# Patient Record
Sex: Male | Born: 1950 | Race: White | Hispanic: No | Marital: Married | Smoking: Former smoker
Health system: Southern US, Community
[De-identification: ages and names within clinical notes are randomized; demographics above are authoritative.]

## PROBLEM LIST (undated history)

## (undated) DIAGNOSIS — G4710 Hypersomnia, unspecified: Secondary | ICD-10-CM

## (undated) DIAGNOSIS — R569 Unspecified convulsions: Secondary | ICD-10-CM

## (undated) DIAGNOSIS — H9190 Unspecified hearing loss, unspecified ear: Secondary | ICD-10-CM

## (undated) DIAGNOSIS — I712 Thoracic aortic aneurysm, without rupture, unspecified: Secondary | ICD-10-CM

## (undated) DIAGNOSIS — E785 Hyperlipidemia, unspecified: Secondary | ICD-10-CM

## (undated) DIAGNOSIS — I10 Essential (primary) hypertension: Secondary | ICD-10-CM

## (undated) HISTORY — DX: Unspecified hearing loss, unspecified ear: H91.90

## (undated) HISTORY — DX: Hyperlipidemia, unspecified: E78.5

## (undated) HISTORY — DX: Essential (primary) hypertension: I10

## (undated) HISTORY — DX: Hypersomnia, unspecified: G47.10

## (undated) HISTORY — DX: Thoracic aortic aneurysm, without rupture, unspecified: I71.20

## (undated) HISTORY — PX: CHOLECYSTECTOMY: SHX55

---

## 2008-06-29 ENCOUNTER — Inpatient Hospital Stay: Payer: Self-pay | Admitting: Vascular Surgery

## 2008-06-29 ENCOUNTER — Other Ambulatory Visit: Payer: Self-pay

## 2009-09-18 IMAGING — CR DG CHEST 1V PORT
1 series · 1 of 1 positions shown · non-contrast
Comparison: none

REASON FOR EXAM: Chest Pain
COMMENTS:

PROCEDURE:     DXR - DXR PORTABLE CHEST SINGLE VIEW  - June 29, 2008  [DATE]
RESULT:     Comparison: No comparison

[view not recorded]
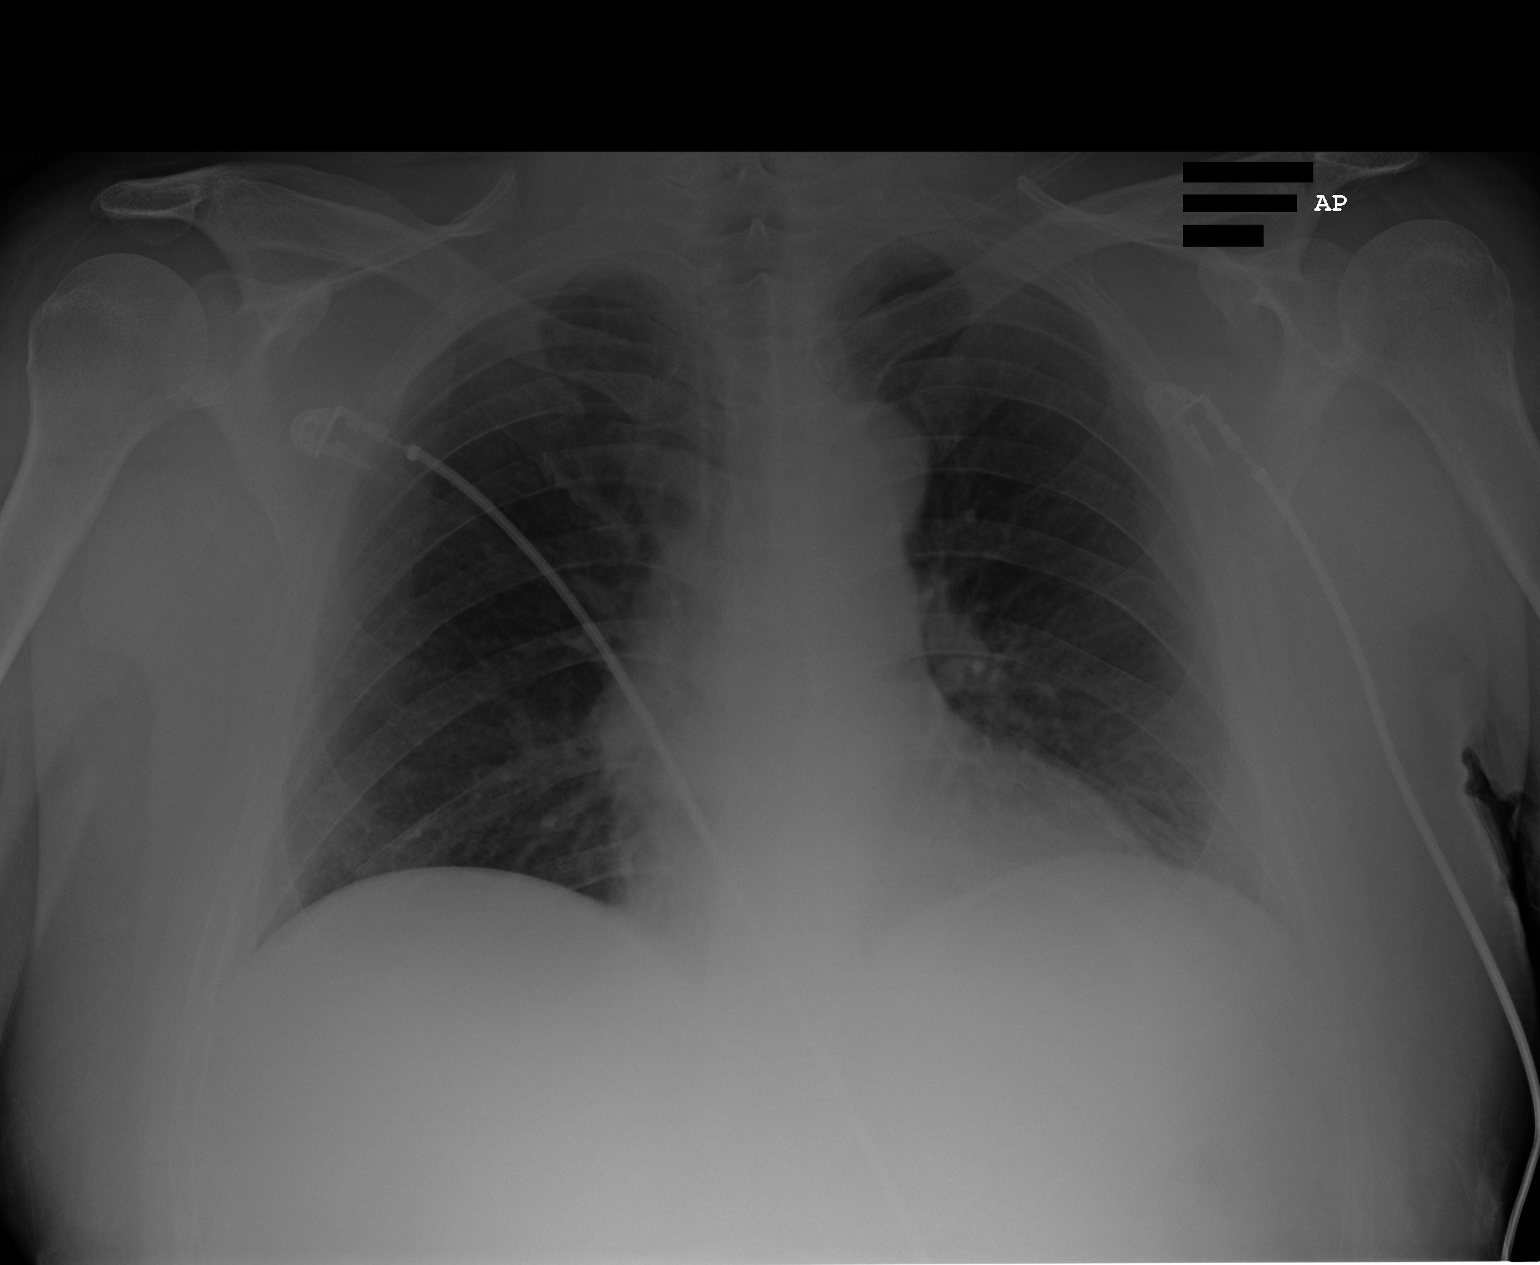

[1 of 1 positions shown; findings below may reference images not displayed]

FINDINGS: Single portable AP chest radiograph is provided. There is no focal
parenchymal opacity, pleural effusion, or pneumothorax. Normal
cardiomediastinal silhouette. The osseous structures are unremarkable.
IMPRESSION: No acute disease of the chest.

## 2009-09-18 IMAGING — US ABDOMEN ULTRASOUND
1 series · 17 of 25 positions shown · non-contrast
Comparison: No comparisons

REASON FOR EXAM: RUQ pain
COMMENTS:

PROCEDURE:     US  - US ABDOMEN GENERAL SURVEY  - June 30, 2008 [DATE]
RESULT:     Indication: Pain
TECHNIQUE: Multiple gray-scale and color-flow Doppler images of the abdomen
are presented for review.

[Series 1: abdomen ultrasound · 17 of 59 slices shown]
[im 1/59]
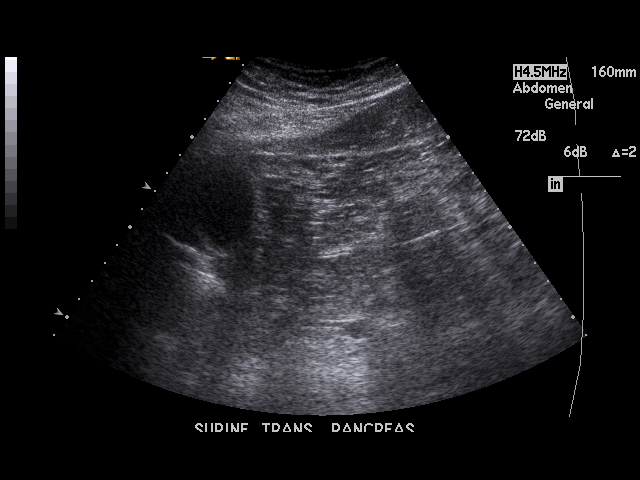
[im 5/59]
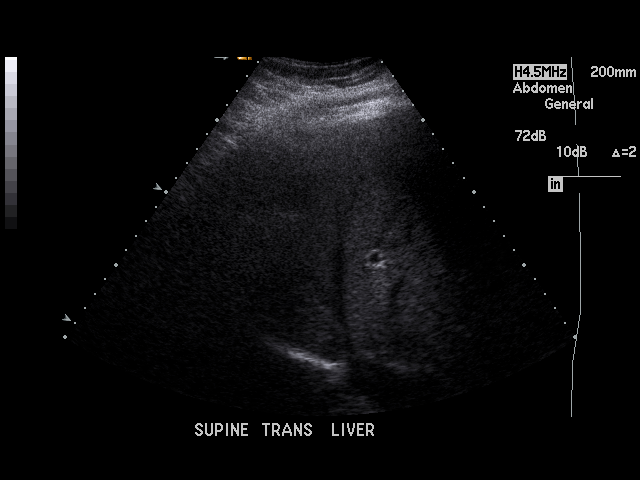
[im 8/59]
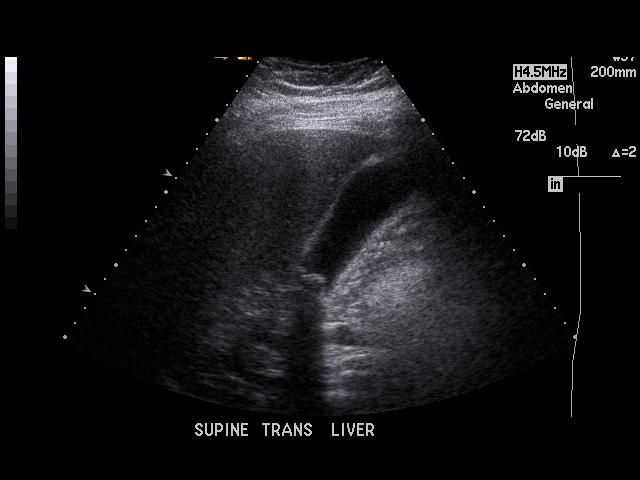
[im 13/59]
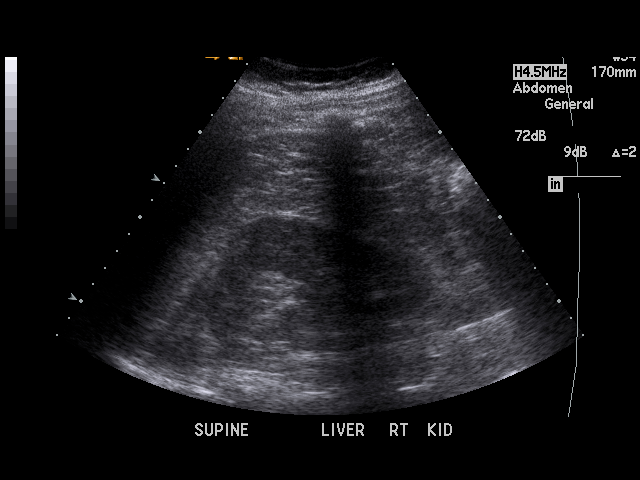
[im 15/59]
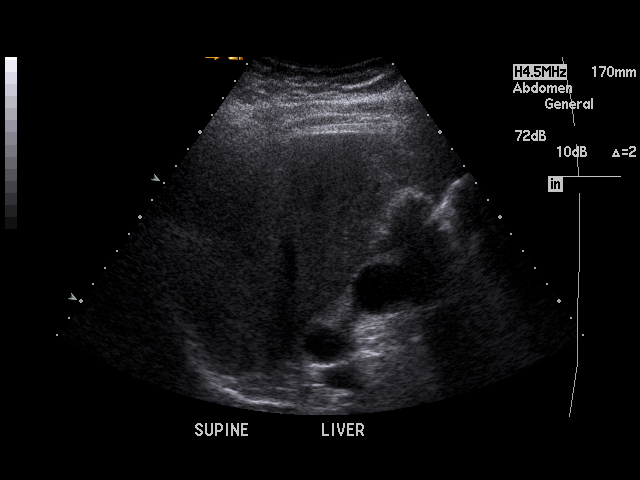
[im 20/59]
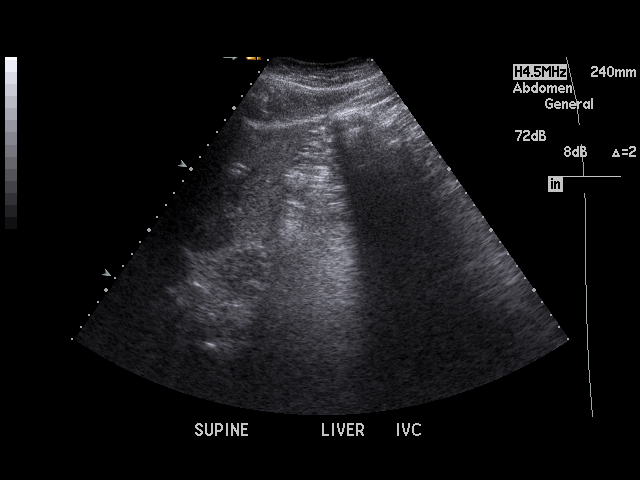
[im 22/59]
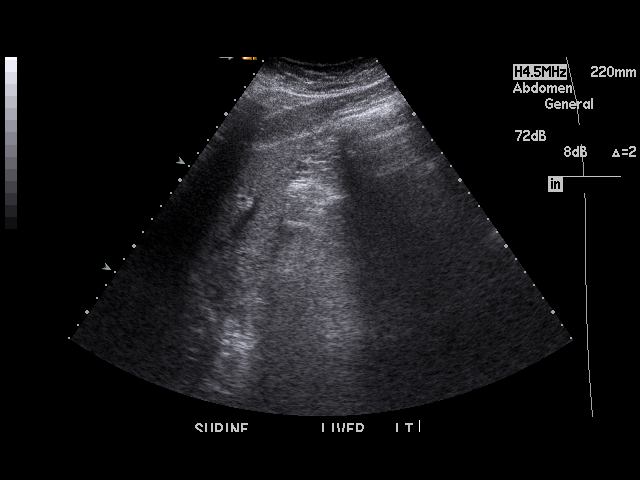
[im 27/59]
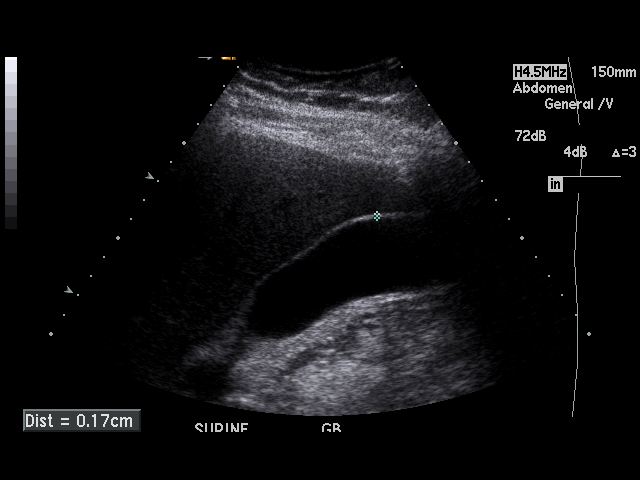
[im 30/59]
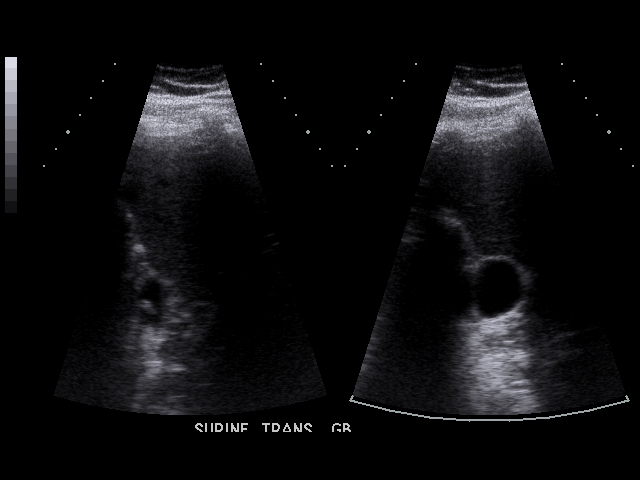
[im 32/59]
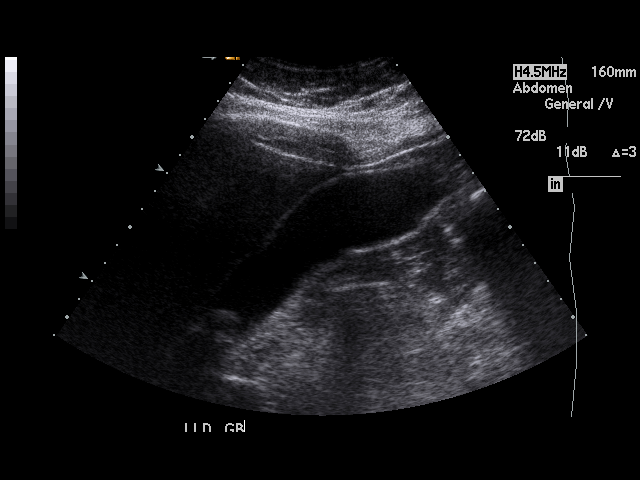
[im 37/59]
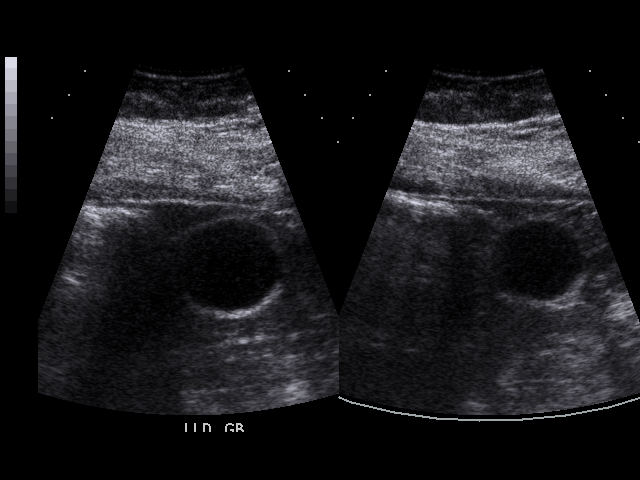
[im 39/59]
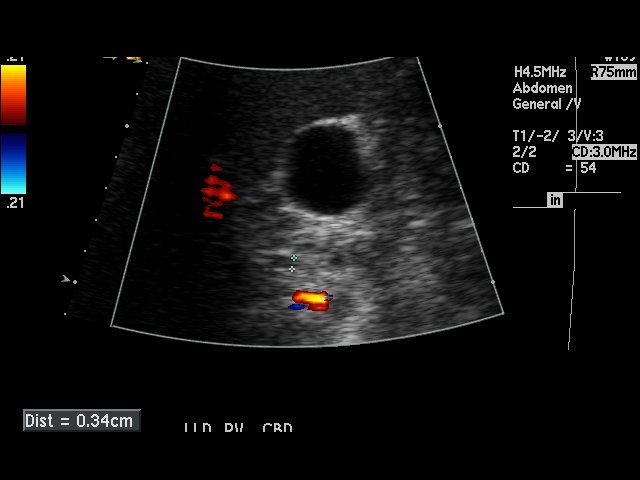
[im 44/59]
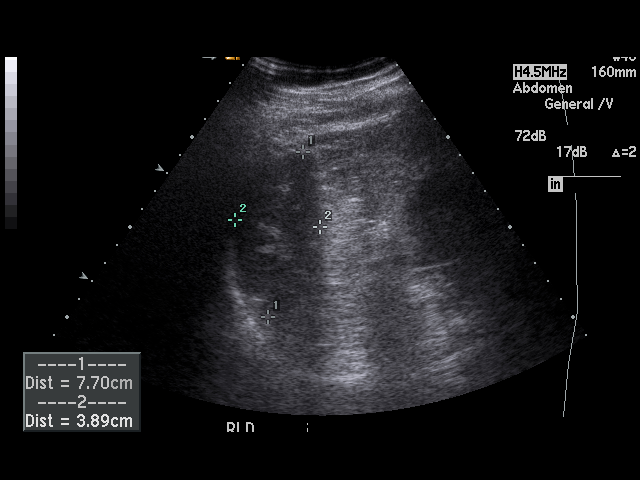
[im 46/59]
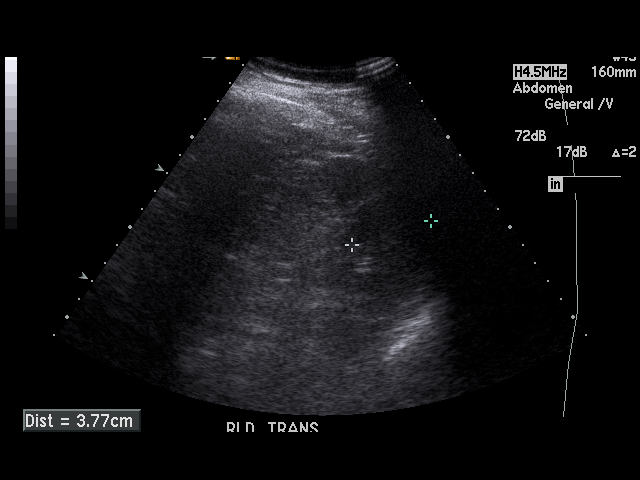
[im 51/59]
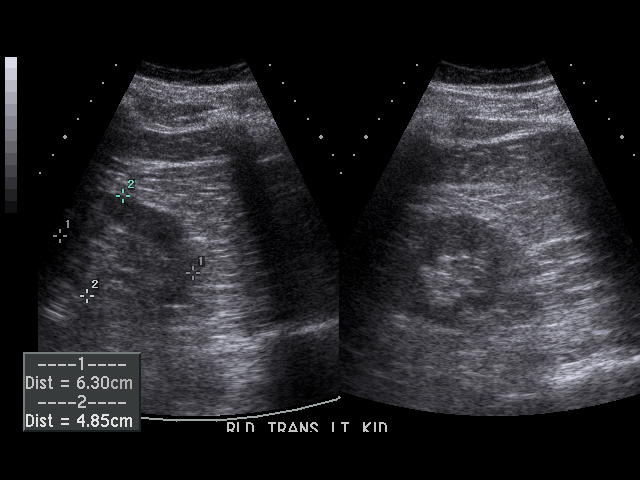
[im 54/59]
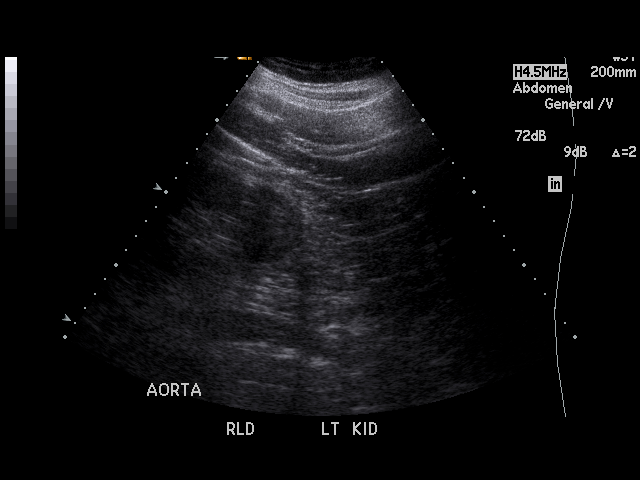
[im 59/59]
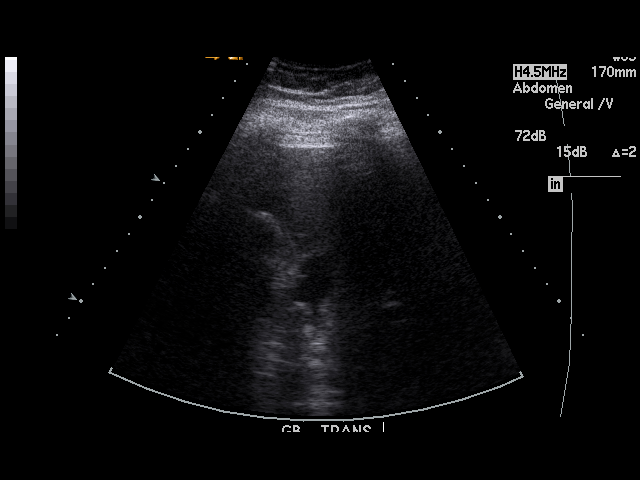

[17 of 25 positions shown; findings below may reference images not displayed]

FINDINGS: Visualized portions of the liver demonstrate normal echogenicity and normal
contours. The liver is without evidence of  focal hepatic lesion. There is
no intra or extrahepatic biliary ductal dilatation. The common duct measures
0.4 cm in maximal diameter. There is an approximately 1.5 cm nonmobile
gallstone in the region of the proximal body and neck region of the
gallbladder with multiple adjacent cholelithiasis. There is no gallbladder
wall thickening or pericholecystic fluid. There is no sonographic Murphy's
sign.

The pancreas is not visualized secondary to overlying bowel gas. The spleen
is unremarkable. Bilateral kidneys are normal in echogenicity and size. The
right kidney measures 11 cm. The left kidney measures 12.7 cm. There are no
renal calculi or hydronephrosis. The abdominal aorta and IVC are
unremarkable.
IMPRESSION: 1. Cholelithiasis without sonographic evidence of acute cholecystitis.

## 2009-09-18 IMAGING — CT CT ABD-PELV W/ CM
1 of 2 series · 15 of 32 positions shown, 19 images · non-contrast
Comparison: No comparison

REASON FOR EXAM: (1) pain; (2) pain///////     IV CONTRAST ONLY
COMMENTS:

PROCEDURE:     CT  - CT ABDOMEN / PELVIS  W  - June 29, 2008  [DATE]
RESULT:     CT abdomen and pelvis
HISTORY: Pain
TECHNIQUE: Multiple axial images of the abdomen and pelvis were performed
from the lung bases to the pubic symphysis, without p.o. contrast and with
100 ml of [id number] intravenous contrast.

[Series 2: abdomen · axial · 0.76mm/px · z∈[-503,-63]mm · 15 of 61 slices shown, 19 images]
[im 3/61  soft-tissue]
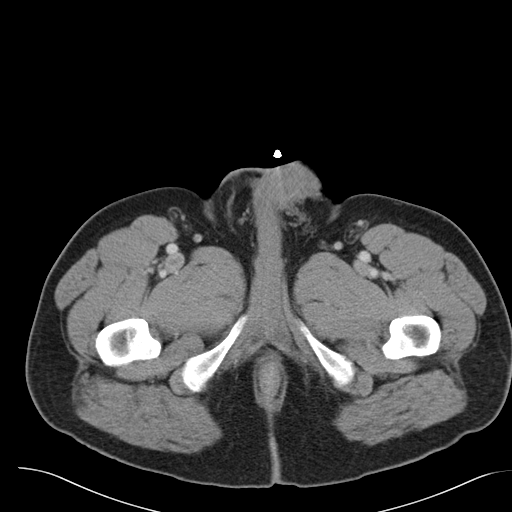
[im 3/61  bone]
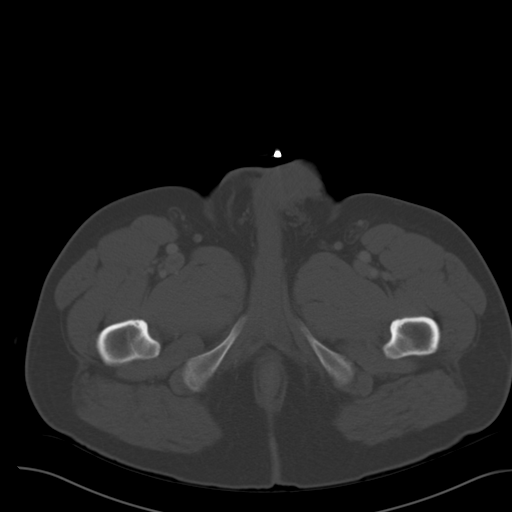
[im 8/61  soft-tissue]
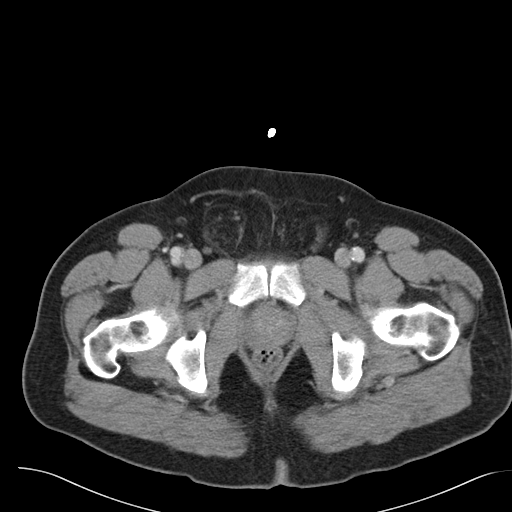
[im 13/61  soft-tissue]
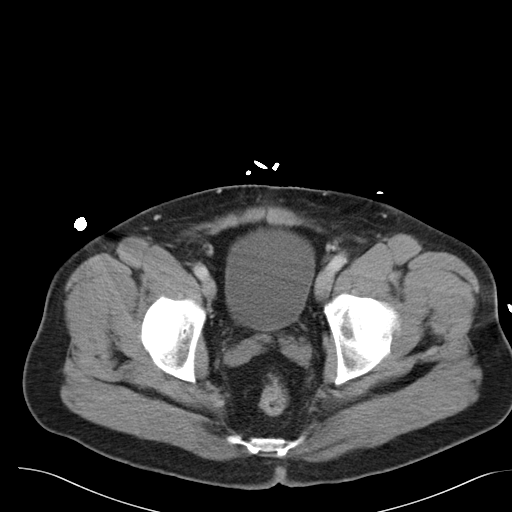
[im 18/61  soft-tissue]
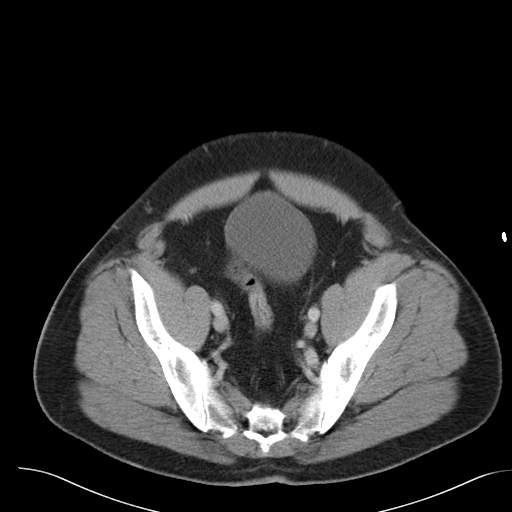
[im 21/61  soft-tissue]
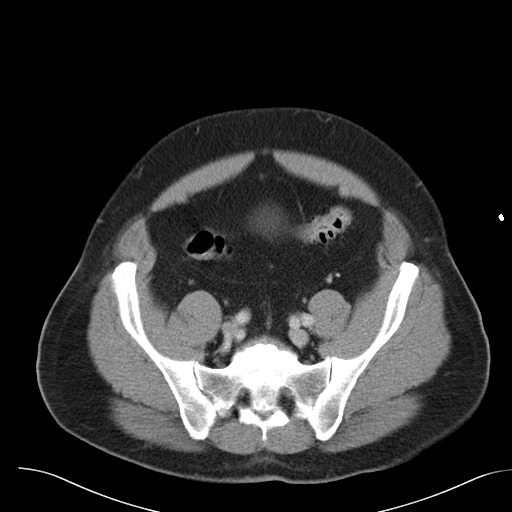
[im 26/61  soft-tissue]
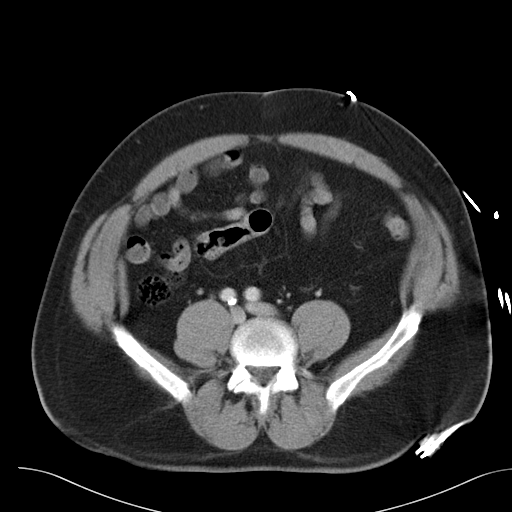
[im 31/61  soft-tissue]
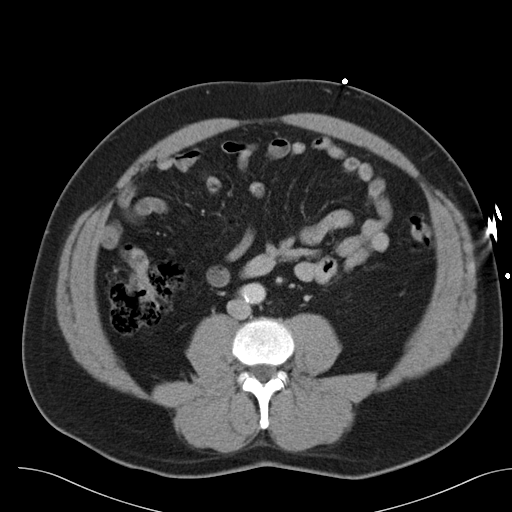
[im 36/61  soft-tissue]
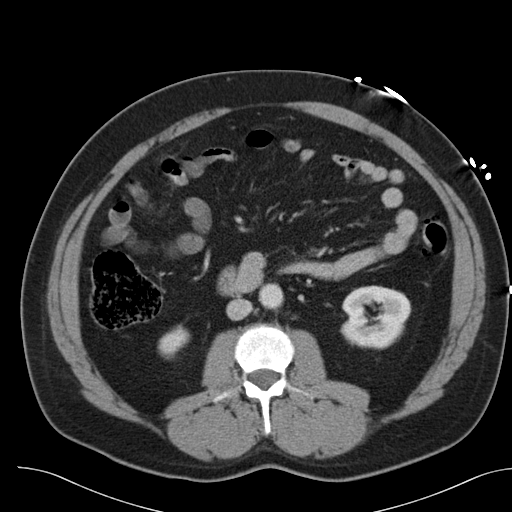
[im 41/61  soft-tissue]
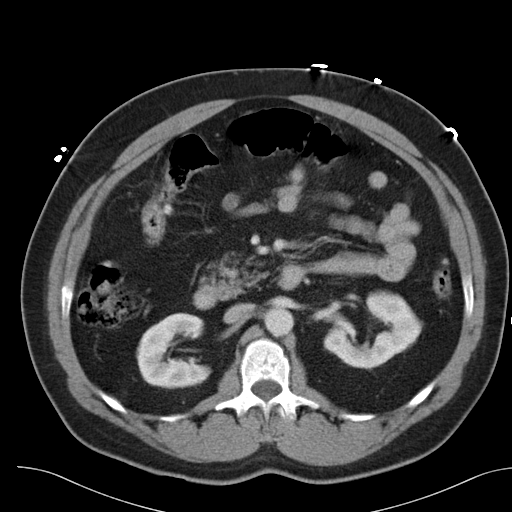
[im 41/61  bone]
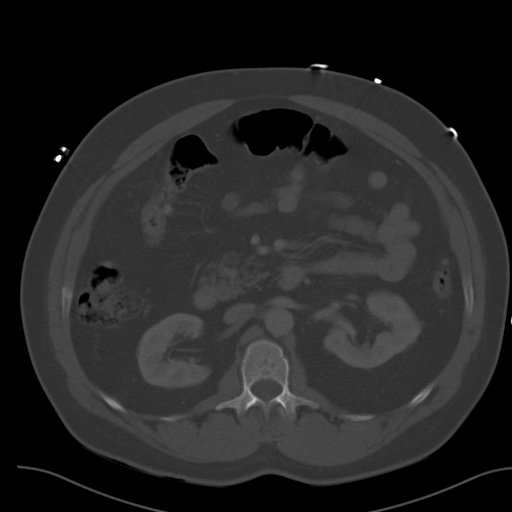
[im 43/61  soft-tissue]
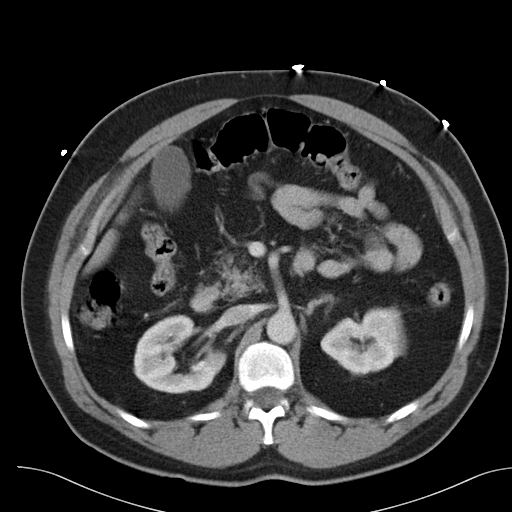
[im 48/61  soft-tissue]
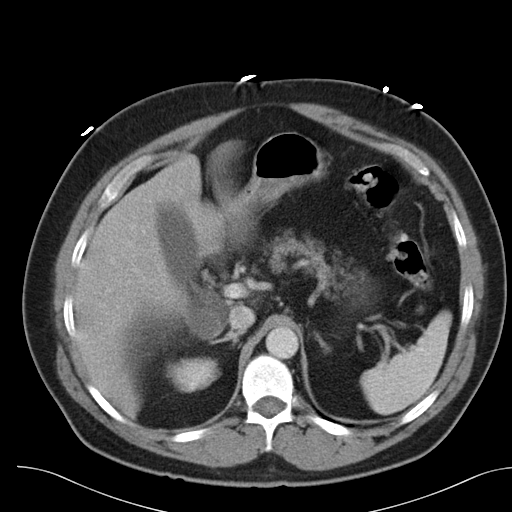
[im 51/61  lung]
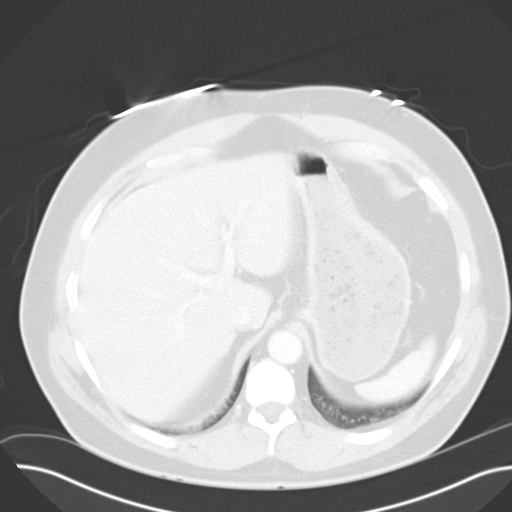
[im 53/61  soft-tissue]
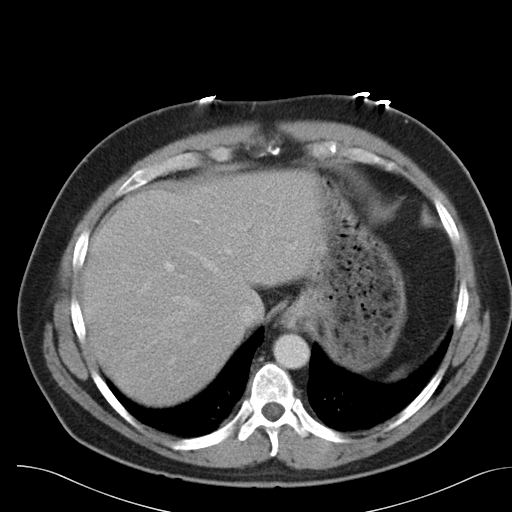
[im 53/61  lung]
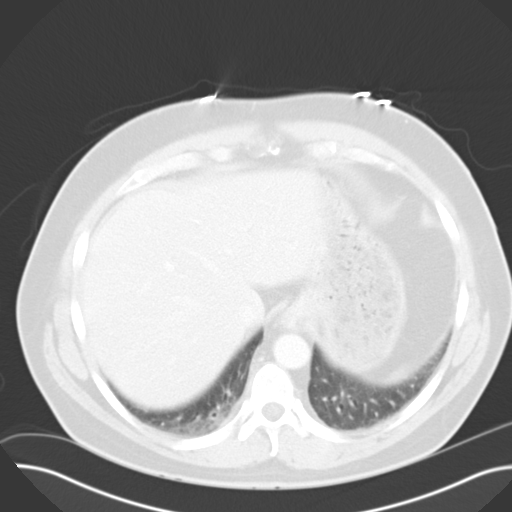
[im 56/61  lung]
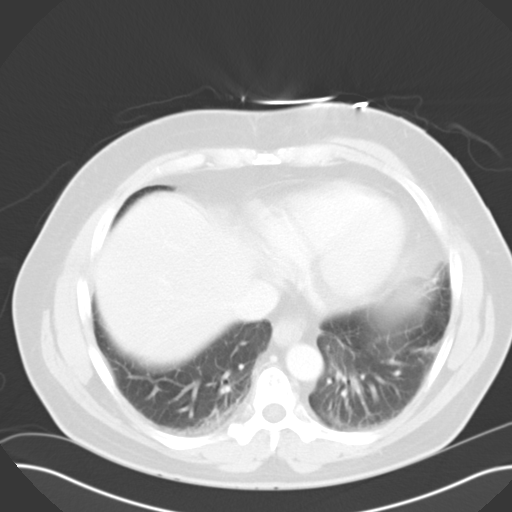
[im 58/61  soft-tissue]
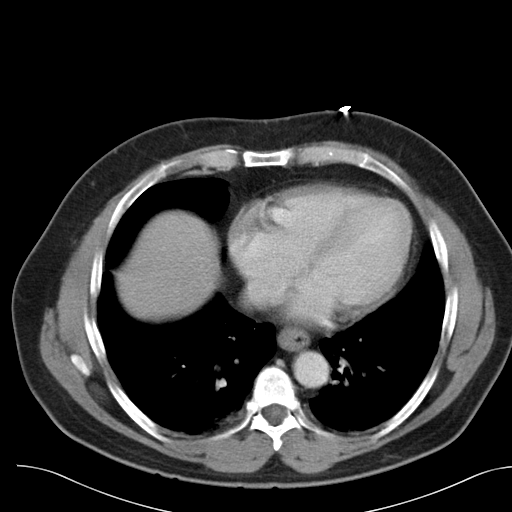
[im 58/61  lung]
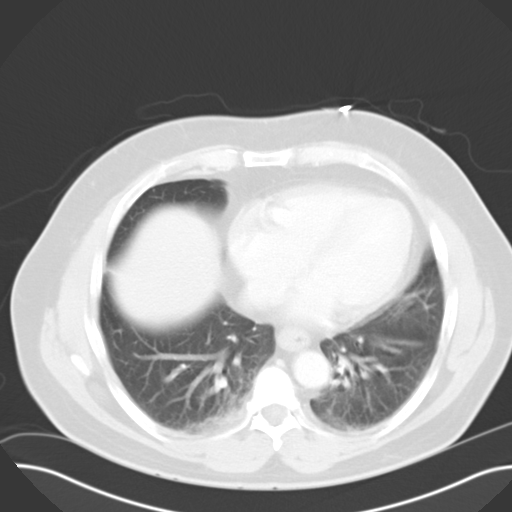

[15 of 32 positions shown; findings below may reference images not displayed]

FINDINGS: The lung bases are clear. There is no pneumothorax. The heart size is
normal.

The liver demonstrates no focal abnormality. There is no intrahepatic or
extrahepatic biliary ductal dilatation. The gallbladder is unremarkable. The
spleen demonstrates no focal abnormality. The kidneys, adrenal glands, and
pancreas are normal. The bladder is unremarkable.

The unopacified stomach, duodenum, small intestine, and large intestine
demonstrate no gross abnormality, evaluation is limited secondary to lack of
enteric contrast. There is diverticulosis of the descending colon without
evidence of diverticulitis. There is no pneumoperitoneum, pneumatosis, or
portal venous gas. There is no abdominal or pelvic free fluid. There is no
lymphadenopathy.

The abdominal aorta is normal caliber with mild atherosclerosis.

The osseous structures are unremarkable.
IMPRESSION: 1. No acute abdominal or pelvic pathology.

2. Small axial type hiatal hernia.

3. There is diverticulosis of the descending colon without evidence of
diverticulitis.

## 2019-05-07 ENCOUNTER — Other Ambulatory Visit: Payer: Self-pay

## 2019-05-07 ENCOUNTER — Emergency Department
Admission: EM | Admit: 2019-05-07 | Discharge: 2019-05-07 | Disposition: A | Discharge status: Home / Self Care | Payer: No Typology Code available for payment source | Attending: Emergency Medicine | Admitting: Emergency Medicine

## 2019-05-07 ENCOUNTER — Encounter: Payer: Self-pay | Admitting: Emergency Medicine

## 2019-05-07 DIAGNOSIS — Y929 Unspecified place or not applicable: Secondary | ICD-10-CM | POA: Diagnosis not present

## 2019-05-07 DIAGNOSIS — T22251A Burn of second degree of right shoulder, initial encounter: Secondary | ICD-10-CM | POA: Insufficient documentation

## 2019-05-07 DIAGNOSIS — X102XXA Contact with fats and cooking oils, initial encounter: Secondary | ICD-10-CM | POA: Insufficient documentation

## 2019-05-07 DIAGNOSIS — Z79899 Other long term (current) drug therapy: Secondary | ICD-10-CM | POA: Insufficient documentation

## 2019-05-07 DIAGNOSIS — Z23 Encounter for immunization: Secondary | ICD-10-CM | POA: Diagnosis not present

## 2019-05-07 DIAGNOSIS — Z87891 Personal history of nicotine dependence: Secondary | ICD-10-CM | POA: Diagnosis not present

## 2019-05-07 DIAGNOSIS — Y93G2 Activity, grilling and smoking food: Secondary | ICD-10-CM | POA: Diagnosis not present

## 2019-05-07 DIAGNOSIS — Y999 Unspecified external cause status: Secondary | ICD-10-CM | POA: Insufficient documentation

## 2019-05-07 DIAGNOSIS — S4991XA Unspecified injury of right shoulder and upper arm, initial encounter: Secondary | ICD-10-CM | POA: Diagnosis present

## 2019-05-07 HISTORY — DX: Unspecified convulsions: R56.9

## 2019-05-07 MED ORDER — SILVER SULFADIAZINE 1 % EX CREA
TOPICAL_CREAM | Freq: Once | CUTANEOUS | Status: AC
  Administered 2019-05-07: 14:00:00 via TOPICAL

## 2019-05-07 MED ORDER — TETANUS-DIPHTH-ACELL PERTUSSIS 5-2.5-18.5 LF-MCG/0.5 IM SUSP
0.5000 mL | Freq: Once | INTRAMUSCULAR | Status: AC
  Administered 2019-05-07: 0.5 mL via INTRAMUSCULAR
  Filled 2019-05-07: qty 0.5

## 2019-05-07 MED ORDER — SILVER SULFADIAZINE 1 % EX CREA: TOPICAL_CREAM | CUTANEOUS | 0 refills | Status: AC

## 2019-05-07 MED ORDER — HYDROCODONE-ACETAMINOPHEN 5-325 MG PO TABS: 1.0000 | ORAL_TABLET | Freq: Four times a day (QID) | ORAL | 0 refills | Status: DC | PRN

## 2019-05-07 NOTE — ED Triage Notes (Signed)
Patient presents to the ED with a grease burn to right shoulder that occurred yesterday.  Patient states he was grilling and using aluminum foil and grease from the foil accidentally got on patient's shoulder.  Patient states area "blistered up" but the blisters have now popped.  Patient is in no obvious distress at this time.

## 2019-05-07 NOTE — ED Notes (Signed)
See triage note  Presents with grease burns to right shoulder  States he was cooking on the grill  And some grease came out of tin foil  Burns to shoulder

## 2019-05-07 NOTE — ED Provider Notes (Signed)
[facility] Emergency Department Provider Note  ____________________________________________   First MD Initiated Contact with Patient 05/07/19 1253     (approximate)  I have reviewed the triage vital signs and the nursing notes.   HISTORY  Chief Complaint Burn   HPI Manuel Morton is a 68 y.o. male presents to the ED with complaint of burn to his right shoulder yesterday while grilling.  Patient states that there was grease on a piece of tinfoil that splashed on his shoulder.  He has used aloe to the area.  Patient is uncertain of his last tetanus.  He gets most of his care through the [facility] hospital.  He denies any other injury.       Past Medical History:  Diagnosis Date  . Seizures ([place])     There are no active problems to display for this patient.   Past Surgical History:  Procedure Laterality Date  . CHOLECYSTECTOMY      Prior to Admission medications   Medication Sig Start Date End Date Taking? Authorizing Provider  amLODipine (NORVASC) 10 MG tablet Take 10 mg by mouth daily.   Yes [provider]  carbamazepine (TEGRETOL XR) 200 MG 12 hr tablet Take 200 mg by mouth 2 (two) times daily.   Yes [provider]  lisinopril (ZESTRIL) 5 MG tablet Take 5 mg by mouth daily.   Yes [provider]  simvastatin (ZOCOR) 40 MG tablet Take 40 mg by mouth daily.   Yes [provider]  HYDROcodone-acetaminophen (NORCO/VICODIN) 5-325 MG tablet Take 1 tablet by mouth every 6 (six) hours as needed for moderate pain. 05/07/19   Johnn Hai, PA-C  silver sulfADIAZINE (SILVADENE) 1 % cream Apply to affected area daily 05/07/19 05/06/20  Johnn Hai, PA-C    Allergies Patient has no known allergies.  No family history on file.  Social History Social History   Tobacco Use  . Smoking status: Former Research scientist (life sciences)  . Smokeless tobacco: Never Used  Substance Use Topics  . Alcohol use: Never    Frequency: Never  . Drug  use: Not on file    Review of Systems Constitutional: No fever/chills Cardiovascular: Denies chest pain. Respiratory: Denies shortness of breath. Gastrointestinal: No abdominal pain.  No nausea, no vomiting. Musculoskeletal: Negative for back pain. Skin: Positive for burn right shoulder. Neurological: Negative for  focal weakness or numbness. ____________________________________________   PHYSICAL EXAM:  VITAL SIGNS: ED Triage Vitals  Enc Vitals Group     BP 05/07/19 1243 (!) 148/89     Pulse --      Resp 05/07/19 1243 16     Temp 05/07/19 1243 99.2 F (37.3 C)     Temp Source 05/07/19 1243 Oral     SpO2 05/07/19 1243 96 %     Weight 05/07/19 1244 198 lb (89.8 kg)     Height 05/07/19 1244 5' 7.5" (1.715 m)     Head Circumference --      Peak Flow --      Pain Score 05/07/19 1244 0     Pain Loc --      Pain Edu? --      Excl. in [place]? --    Constitutional: Alert and oriented. Well appearing and in no acute distress. Eyes: Conjunctivae are normal.  Head: Atraumatic. Neck: No stridor.   Cardiovascular: Normal rate, regular rhythm. Grossly normal heart sounds.  Good peripheral circulation. Respiratory: Normal respiratory effort.  No retractions. Lungs CTAB. Gastrointestinal: Soft  and nontender. No distention. No abdominal bruits. No CVA tenderness. Musculoskeletal: No lower extremity tenderness nor edema.  No joint effusions. Neurologic:  Normal speech and language. No gross focal neurologic deficits are appreciated. No gait instability. Skin:  Skin is warm, dry.  Right anterior shoulder has a large open blister without any evidence of infection at this time from his grease burn.  Area measures approximately 6 cm x 7 cm.  There is a smaller blister formation above this area.  No weeping from these areas as they have been open it appears for some time. Psychiatric: Mood and affect are normal. Speech and behavior are normal.  ____________________________________________    LABS (all labs ordered are listed, but only abnormal results are displayed)  Labs Reviewed - No data to display  PROCEDURES  Procedure(s) performed (including Critical Care):  Procedures   ____________________________________________   INITIAL IMPRESSION / ASSESSMENT AND PLAN / ED COURSE  As part of my medical decision making, I reviewed the following data within the electronic MEDICAL RECORD NUMBER Notes from prior ED visits and [place] Controlled Substance Database  68 year old male presents to the ED with complaint of an open burn that occurred yesterday while he was grilling.  Patient states some grease came out on a piece of tinfoil and burned him on his right shoulder.  Patient applied aloe to the area yesterday.  He is uncertain of his last tetanus and this was updated while here in the ED.  No evidence of infection and patient was told to apply Silvadene daily and watch for any signs of infection.  A prescription for Silvadene was sent to his pharmacy along with a prescription for Norco as needed for pain.  ____________________________________________   FINAL CLINICAL IMPRESSION(S) / ED DIAGNOSES  Final diagnoses:  Second degree burn of right shoulder, initial encounter     ED Discharge Orders         Ordered    silver sulfADIAZINE (SILVADENE) 1 % cream     05/07/19 1344    HYDROcodone-acetaminophen (NORCO/VICODIN) 5-325 MG tablet  Every 6 hours PRN     05/07/19 1424           Note:  This document was prepared using Dragon voice recognition software and may include unintentional dictation errors.    Tommi RumpsSummers, Kaheem Halleck L, PA-C 05/07/19 1426    Emily FilbertWilliams, Jonathan E, MD 05/07/19 [phone]

## 2019-05-07 NOTE — Discharge Instructions (Addendum)
Keep area clean and dry.  Use Silvadene cream once daily to the area and watch for any signs of infection.  When you take the dressing off initially it will look like the Silvadene cream is somewhat yellow which is a mixture of the cream and body fluids.  Return to the emergency department in 2 days for recheck of your wound if there is any question return sooner.  Also your tetanus was updated today.

## 2019-06-28 ENCOUNTER — Encounter: Payer: Self-pay | Admitting: Emergency Medicine

## 2019-06-28 ENCOUNTER — Other Ambulatory Visit: Payer: Self-pay

## 2019-06-28 ENCOUNTER — Emergency Department
Admission: EM | Admit: 2019-06-28 | Discharge: 2019-06-29 | Disposition: A | Discharge status: Home / Self Care | Payer: No Typology Code available for payment source | Attending: Emergency Medicine | Admitting: Emergency Medicine

## 2019-06-28 DIAGNOSIS — Z87891 Personal history of nicotine dependence: Secondary | ICD-10-CM | POA: Diagnosis not present

## 2019-06-28 DIAGNOSIS — K4090 Unilateral inguinal hernia, without obstruction or gangrene, not specified as recurrent: Secondary | ICD-10-CM | POA: Diagnosis not present

## 2019-06-28 DIAGNOSIS — Z79899 Other long term (current) drug therapy: Secondary | ICD-10-CM | POA: Diagnosis not present

## 2019-06-28 DIAGNOSIS — R1031 Right lower quadrant pain: Secondary | ICD-10-CM | POA: Diagnosis present

## 2019-06-28 DIAGNOSIS — N201 Calculus of ureter: Secondary | ICD-10-CM | POA: Diagnosis not present

## 2019-06-28 DIAGNOSIS — N200 Calculus of kidney: Secondary | ICD-10-CM

## 2019-06-28 LAB — COMPREHENSIVE METABOLIC PANEL
ALT: 23 U/L (ref 0–44)
AST: 24 U/L (ref 15–41)
Albumin: 4.6 g/dL (ref 3.5–5.0)
Alkaline Phosphatase: 66 U/L (ref 38–126)
Anion gap: 12 (ref 5–15)
BUN: 21 mg/dL (ref 8–23)
CO2: 25 mmol/L (ref 22–32)
Calcium: 9.8 mg/dL (ref 8.9–10.3)
Chloride: 102 mmol/L (ref 98–111)
Creatinine, Ser: 1.58 mg/dL — ABNORMAL HIGH (ref 0.61–1.24)
GFR calc Af Amer: 52 mL/min — ABNORMAL LOW (ref >60)
GFR calc non Af Amer: 45 mL/min — ABNORMAL LOW (ref >60)
Glucose, Bld: 116 mg/dL — ABNORMAL HIGH (ref 70–99)
Potassium: 4.2 mmol/L (ref 3.5–5.1)
Sodium: 139 mmol/L (ref 135–145)
Total Bilirubin: 0.7 mg/dL (ref 0.3–1.2)
Total Protein: 8.3 g/dL — ABNORMAL HIGH (ref 6.5–8.1)

## 2019-06-28 LAB — CBC WITH DIFFERENTIAL/PLATELET
Abs Immature Granulocytes: 0.08 10*3/uL — ABNORMAL HIGH (ref 0.00–0.07)
Basophils Absolute: 0.1 10*3/uL (ref 0.0–0.1)
Basophils Relative: 0 %
Eosinophils Absolute: 0.1 10*3/uL (ref 0.0–0.5)
Eosinophils Relative: 1 %
HCT: 46.5 % (ref 39.0–52.0)
Hemoglobin: 15.7 g/dL (ref 13.0–17.0)
Immature Granulocytes: 1 %
Lymphocytes Relative: 11 %
Lymphs Abs: 2 10*3/uL (ref 0.7–4.0)
MCH: 31.8 pg (ref 26.0–34.0)
MCHC: 33.8 g/dL (ref 30.0–36.0)
MCV: 94.3 fL (ref 80.0–100.0)
Monocytes Absolute: 1.5 10*3/uL — ABNORMAL HIGH (ref 0.1–1.0)
Monocytes Relative: 9 %
Neutro Abs: 13.7 10*3/uL — ABNORMAL HIGH (ref 1.7–7.7)
Neutrophils Relative %: 78 %
Platelets: 251 10*3/uL (ref 150–400)
RBC: 4.93 MIL/uL (ref 4.22–5.81)
RDW: 12.9 % (ref 11.5–15.5)
WBC: 17.4 10*3/uL — ABNORMAL HIGH (ref 4.0–10.5)
nRBC: 0 % (ref 0.0–0.2)

## 2019-06-28 LAB — URINALYSIS, COMPLETE (UACMP) WITH MICROSCOPIC
Bacteria, UA: NONE SEEN
Bilirubin Urine: NEGATIVE
Glucose, UA: NEGATIVE mg/dL
Ketones, ur: 5 mg/dL — AB
Leukocytes,Ua: NEGATIVE
Nitrite: NEGATIVE
Protein, ur: NEGATIVE mg/dL
Specific Gravity, Urine: 1.018 (ref 1.005–1.030)
Squamous Epithelial / HPF: NONE SEEN (ref 0–5)
pH: 5 (ref 5.0–8.0)

## 2019-06-28 LAB — LIPASE, BLOOD: Lipase: 19 U/L (ref 11–51)

## 2019-06-28 MED ORDER — IOHEXOL 9 MG/ML PO SOLN
500.0000 mL | ORAL | Status: AC
  Administered 2019-06-28: 500 mL via ORAL

## 2019-06-28 NOTE — ED Triage Notes (Signed)
Patient ambulatory to triage with steady gait, without difficulty or distress noted, mask in place; pt reports lower rt abd pain, nonradiating with no accomp symptoms since yesterday

## 2019-06-28 NOTE — ED Provider Notes (Signed)
[facility] Emergency Department Provider Note  Time seen: 11:19 PM  I have reviewed the triage vital signs and the nursing notes.   HISTORY  Chief Complaint Abdominal Pain   HPI Manuel Morton is a 68 y.o. male with a past medical history of hypertension, seizure disorder, presents to the emergency department for abdominal pain.  According to the patient over the past 24 hours he has developed worsening lower abdominal pain, more so in the right lower quadrant.  Patient denies any fever, cough or shortness of breath.  Describes the pain as moderate dull aching pain in the right lower quadrant.  History of cholecystectomy but still has his appendix.  States normally has 2 bowel movements per day but is not had a bowel movement in approximately 36 hours.  No dysuria or hematuria.  Past Medical History:  Diagnosis Date  . Seizures ([facility])     There are no active problems to display for this patient.   Past Surgical History:  Procedure Laterality Date  . CHOLECYSTECTOMY      Prior to Admission medications   Medication Sig Start Date End Date Taking? Authorizing Provider  amLODipine (NORVASC) 10 MG tablet Take 10 mg by mouth daily.    [provider]  carbamazepine (TEGRETOL XR) 200 MG 12 hr tablet Take 200 mg by mouth 2 (two) times daily.    [provider]  HYDROcodone-acetaminophen (NORCO/VICODIN) 5-325 MG tablet Take 1 tablet by mouth every 6 (six) hours as needed for moderate pain. 05/07/19   Tommi Rumps, PA-C  lisinopril (ZESTRIL) 5 MG tablet Take 5 mg by mouth daily.    [provider]  silver sulfADIAZINE (SILVADENE) 1 % cream Apply to affected area daily 05/07/19 05/06/20  Tommi Rumps, PA-C  simvastatin (ZOCOR) 40 MG tablet Take 40 mg by mouth daily.    [provider]    No Known Allergies  No family history on file.  Social History Social History   Tobacco Use  . Smoking status: Former Games developer  .  Smokeless tobacco: Never Used  Substance Use Topics  . Alcohol use: Never    Frequency: Never  . Drug use: Not on file    Review of Systems Constitutional: Negative for fever Cardiovascular: Negative for chest pain. Respiratory: Negative for shortness of breath. Gastrointestinal: Lower abdominal pain.  Positive for constipation.  Negative for nausea or vomiting. Genitourinary: Negative for urinary compaints Musculoskeletal: Negative for musculoskeletal complaints Skin: Negative for skin complaints  Neurological: Negative for headache All other ROS negative  ____________________________________________   PHYSICAL EXAM:  VITAL SIGNS: ED Triage Vitals  Enc Vitals Group     BP 06/28/19 1915 (!) 172/100     Pulse Rate 06/28/19 1915 87     Resp 06/28/19 1915 20     Temp 06/28/19 1915 99.6 F (37.6 C)     Temp Source 06/28/19 1915 Oral     SpO2 06/28/19 1915 95 %     Weight 06/28/19 1916 195 lb (88.5 kg)     Height 06/28/19 1916 5\' 8"  (1.727 m)     Head Circumference --      Peak Flow --      Pain Score 06/28/19 1915 8     Pain Loc --      Pain Edu? --      Excl. in [place]? --    Constitutional: Alert and oriented. Well appearing and in no distress. Eyes: Normal exam ENT  Head: Normocephalic and atraumatic.      Mouth/Throat: Mucous membranes are moist. Cardiovascular: Normal rate, regular rhythm. Respiratory: Normal respiratory effort without tachypnea nor retractions. Breath sounds are clear Gastrointestinal: Soft, mild tenderness in the very low right lower quadrant almost inguinal canal area.  No rebound guarding or distention. Musculoskeletal: Nontender with normal range of motion in all extremities.  Neurologic:  Normal speech and language. No gross focal neurologic deficits Skin:  Skin is warm, dry and intact.    ____________________________________________     RADIOLOGY  CT scan shows an obstructing 5 mm right ureteral stone.  It also shows fat-containing  umbilical hernia as well as bilateral inguinal hernias right greater than left with fat stranding around the right inguinal hernia.  ____________________________________________   INITIAL IMPRESSION / ASSESSMENT AND PLAN / ED COURSE  Pertinent labs & imaging results that were available during my care of the patient were reviewed by me and considered in my medical decision making (see chart for details).   Patient presents to the emergency department for lower abdominal pain, mostly right lower quadrant.  Differential would include appendicitis, SBO, hernia, UTI/pyelonephritis/ureterolithiasis, colitis or diverticulitis.  Patient's labs show leukocytosis of 17,000, borderline temperature 99.6.  We will proceed with CT imaging to further evaluate.  Patient agreeable to plan of care.  CT is resulted showing a 5 mm right mid ureteral stone.  It also shows umbilical hernia with stranding, patient is not tender over the umbilicus.  It does show bilateral inguinal hernias containing fat with stranding on the right, this does correlate to the patient's point tenderness on exam.  However given the 5 mm mid ureteral stone I highly suspect this also to be acute.  Patient will need follow-up with both urology as well as surgery.  I have added on a urine culture of the patient's urinalysis.  We will place the patient on Flomax, pain and nausea medication to be used as needed as well as a urine strainer.  We will have the patient follow-up with urology as well as general surgery.  Patient is agreeable to plan of care.  I discussed return precautions with the patient.  Manuel Morton was evaluated in Emergency Department on 06/28/2019 for the symptoms described in the history of present illness. He was evaluated in the context of the global COVID-19 pandemic, which necessitated consideration that the patient might be at risk for infection with the SARS-CoV-2 virus that causes COVID-19. Institutional protocols and  algorithms that pertain to the evaluation of patients at risk for COVID-19 are in a state of rapid change based on information released by regulatory bodies including the CDC and federal and state organizations. These policies and algorithms were followed during the patient's care in the ED.  ____________________________________________   FINAL CLINICAL IMPRESSION(S) / ED DIAGNOSES  Lower abdominal pain Kidney stone Inguinal hernia   Harvest Dark, MD 06/29/19 0139

## 2019-06-29 ENCOUNTER — Emergency Department: Payer: No Typology Code available for payment source

## 2019-06-29 MED ORDER — TAMSULOSIN HCL 0.4 MG PO CAPS: 0.4000 mg | ORAL_CAPSULE | Freq: Every day | ORAL | 0 refills | Status: AC

## 2019-06-29 MED ORDER — OXYCODONE-ACETAMINOPHEN 5-325 MG PO TABS: 1.0000 | ORAL_TABLET | ORAL | 0 refills | Status: DC | PRN

## 2019-06-29 MED ORDER — IOHEXOL 300 MG/ML  SOLN
100.0000 mL | Freq: Once | INTRAMUSCULAR | Status: AC | PRN
  Administered 2019-06-29: 01:00:00 100 mL via INTRAVENOUS

## 2019-06-29 MED ORDER — ONDANSETRON 4 MG PO TBDP: 4.0000 mg | ORAL_TABLET | Freq: Three times a day (TID) | ORAL | 0 refills | Status: DC | PRN

## 2019-06-29 NOTE — Discharge Instructions (Addendum)
Please take your pain and nausea medication as needed, as prescribed.  Please take your Flomax each day, use a urine strainer when you urinate.  Please call the number provided for both urology as well as general surgery to arrange follow-up appointments as soon as possible.  Return to the emergency department for any significant worsening of pain, development of fever, or any other symptom personally concerning to yourself.

## 2019-06-29 NOTE — ED Notes (Signed)
Pt has completed drinking contrast. CT notified.

## 2019-06-30 LAB — URINE CULTURE: Culture: NO GROWTH

## 2023-06-30 ENCOUNTER — Encounter: Payer: Self-pay | Admitting: *Deleted

## 2023-07-04 ENCOUNTER — Institutional Professional Consult (permissible substitution): Payer: No Typology Code available for payment source | Admitting: Neurology

## 2023-07-13 ENCOUNTER — Institutional Professional Consult (permissible substitution): Payer: No Typology Code available for payment source | Admitting: Neurology

## 2023-07-21 ENCOUNTER — Encounter: Payer: Self-pay | Admitting: *Deleted

## 2023-07-21 DIAGNOSIS — L853 Xerosis cutis: Secondary | ICD-10-CM | POA: Insufficient documentation

## 2023-07-21 DIAGNOSIS — K219 Gastro-esophageal reflux disease without esophagitis: Secondary | ICD-10-CM | POA: Insufficient documentation

## 2023-07-21 DIAGNOSIS — R053 Chronic cough: Secondary | ICD-10-CM | POA: Insufficient documentation

## 2023-07-21 DIAGNOSIS — R030 Elevated blood-pressure reading, without diagnosis of hypertension: Secondary | ICD-10-CM | POA: Insufficient documentation

## 2023-07-21 DIAGNOSIS — H902 Conductive hearing loss, unspecified: Secondary | ICD-10-CM | POA: Insufficient documentation

## 2023-07-21 DIAGNOSIS — R1313 Dysphagia, pharyngeal phase: Secondary | ICD-10-CM | POA: Insufficient documentation

## 2023-07-21 DIAGNOSIS — N201 Calculus of ureter: Secondary | ICD-10-CM | POA: Insufficient documentation

## 2023-07-21 DIAGNOSIS — N200 Calculus of kidney: Secondary | ICD-10-CM | POA: Insufficient documentation

## 2023-07-21 DIAGNOSIS — G40009 Localization-related (focal) (partial) idiopathic epilepsy and epileptic syndromes with seizures of localized onset, not intractable, without status epilepticus: Secondary | ICD-10-CM | POA: Insufficient documentation

## 2023-07-21 DIAGNOSIS — R1310 Dysphagia, unspecified: Secondary | ICD-10-CM | POA: Insufficient documentation

## 2023-07-21 DIAGNOSIS — E785 Hyperlipidemia, unspecified: Secondary | ICD-10-CM | POA: Insufficient documentation

## 2023-07-21 DIAGNOSIS — L570 Actinic keratosis: Secondary | ICD-10-CM | POA: Insufficient documentation

## 2023-07-21 DIAGNOSIS — I712 Thoracic aortic aneurysm, without rupture, unspecified: Secondary | ICD-10-CM | POA: Insufficient documentation

## 2023-07-21 DIAGNOSIS — Z7729 Contact with and (suspected ) exposure to other hazardous substances: Secondary | ICD-10-CM | POA: Insufficient documentation

## 2023-07-21 DIAGNOSIS — I10 Essential (primary) hypertension: Secondary | ICD-10-CM | POA: Insufficient documentation

## 2023-07-21 DIAGNOSIS — S62309A Unspecified fracture of unspecified metacarpal bone, initial encounter for closed fracture: Secondary | ICD-10-CM | POA: Insufficient documentation

## 2023-07-21 DIAGNOSIS — L309 Dermatitis, unspecified: Secondary | ICD-10-CM | POA: Insufficient documentation

## 2023-07-21 DIAGNOSIS — U071 COVID-19: Secondary | ICD-10-CM | POA: Insufficient documentation

## 2023-07-25 ENCOUNTER — Encounter: Payer: Self-pay | Admitting: Neurology

## 2023-07-25 ENCOUNTER — Ambulatory Visit (INDEPENDENT_AMBULATORY_CARE_PROVIDER_SITE_OTHER): Payer: No Typology Code available for payment source | Admitting: Neurology

## 2023-07-25 VITALS — BP 152/92 | HR 64 | Ht 67.0 in | Wt 215.0 lb

## 2023-07-25 DIAGNOSIS — Z9189 Other specified personal risk factors, not elsewhere classified: Secondary | ICD-10-CM | POA: Diagnosis not present

## 2023-07-25 DIAGNOSIS — G4719 Other hypersomnia: Secondary | ICD-10-CM

## 2023-07-25 DIAGNOSIS — R0683 Snoring: Secondary | ICD-10-CM

## 2023-07-25 DIAGNOSIS — G4733 Obstructive sleep apnea (adult) (pediatric): Secondary | ICD-10-CM | POA: Diagnosis not present

## 2023-07-25 DIAGNOSIS — E66811 Obesity, class 1: Secondary | ICD-10-CM

## 2023-07-25 DIAGNOSIS — R030 Elevated blood-pressure reading, without diagnosis of hypertension: Secondary | ICD-10-CM

## 2023-07-25 DIAGNOSIS — R0681 Apnea, not elsewhere classified: Secondary | ICD-10-CM

## 2023-07-25 NOTE — Patient Instructions (Signed)

## 2023-07-25 NOTE — Progress Notes (Signed)
Subjective:    Patient ID: Manuel Morton is a 72 y.o. male.  HPI    Huston Foley, MD, PhD [facility] [address], [address] [address] [place], [place] [address]  I saw your patient, Manuel Morton, as a referral from the [place] in [place], [place], for evaluation of his sleep disorder, in particular, concern for underlying obstructive sleep apnea.  The patient is unaccompanied today.  Manuel Morton is a 72 year old male with an underlying medical history of hypertension, hyperlipidemia, aortic aneurysm of the thoracic aorta, hearing loss, seizures (per chart review), right lid ptosis, and obesity, who reports snoring and excessive daytime somnolence as well as witnessed apneas, per wife's report and also doing a recent colonoscopy.  He reports that he had a sleep study several years ago and was offered CPAP therapy but declined it at the time.  His Epworth sleepiness score is 18 out of 24, fatigue severity score is 45 out of 63.  Of note, he works as a Engineer, structural.  He drives.  He does not wear his hearing aids.  [facility]. His bedtime is generally around 9 PM, rise time is around 5 AM.  He denies nightly nocturia and denies recurrent nocturnal or morning headaches.  He is not aware of a family history of sleep apnea.  He has tried to lose weight.  He lives with his wife, they have 2 dogs in the household, 1 dog sleeps in a kennel sleeps on the bed, his wife side.  He has a TV in his bedroom but it is typically not on at night.  He drinks caffeine in the form of diet soda, 2 cans/day.  He is a current does not currently drink any alcohol.  His Past Medical History Is Significant For: Past Medical History:  Diagnosis Date   Hearing loss    Hyperlipidemia    Hypersomnia    Hypertension    Seizures (HCC)    Thoracic aortic aneurysm (TAA) (HCC)     His Past Surgical History Is Significant For: Past Surgical History:  Procedure Laterality Date    CHOLECYSTECTOMY      His Family History Is Significant For: Family History  Problem Relation Age of Onset   Sleep apnea Neg Hx     His Social History Is Significant For: Social History   Socioeconomic History   Marital status: Married    Spouse name: Not on file   Number of children: Not on file   Years of education: Not on file   Highest education level: Not on file  Occupational History   Not on file  Tobacco Use   Smoking status: Former   Smokeless tobacco: Never  Substance and Sexual Activity   Alcohol use: Never   Drug use: Not on file   Sexual activity: Not on file  Other Topics Concern   Not on file  Social History Narrative   Not on file   Social Determinants of Health   Financial Resource Strain: Not on file  Food Insecurity: Not on file  Transportation Needs: Not on file  Physical Activity: Not on file  Stress: Not on file  Social Connections: Not on file    His Allergies Are:  No Known Allergies:   His Current Medications Are:  Outpatient Encounter Medications as of 07/25/2023  Medication Sig   amLODipine (NORVASC) 2.5 MG tablet Take by mouth.   aspirin EC 81 MG tablet Take 81 mg by mouth  daily. Swallow whole.   azelastine (ASTELIN) 0.1 % nasal spray Place 2 sprays into both nostrils 2 (two) times daily as needed for rhinitis. Use in each nostril as directed   budesonide (RHINOCORT AQUA) 32 MCG/ACT nasal spray USE 1 SPRAY INTO EACH NOSTRIL TWO TIMES A DAY FOR ALLERGIC RHINITIS   carbamazepine (TEGRETOL XR) 200 MG 12 hr tablet Take 400 mg by mouth 2 (two) times daily.   Carboxymethylcellulose Sod PF 0.5 % SOLN Apply to eye.   cetirizine (ZYRTEC) 10 MG tablet Take by mouth.   cholecalciferol (VITAMIN D3) 25 MCG (1000 UNIT) tablet Take 1,000 Units by mouth daily.   erythromycin ophthalmic ointment Apply to eye.   ibuprofen (ADVIL) 800 MG tablet Take 800 mg by mouth daily as needed.   losartan (COZAAR) 25 MG tablet Take 25 mg by mouth daily.    pantoprazole (PROTONIX) 40 MG tablet Take 1 tablet by mouth 2 (two) times daily.   simvastatin (ZOCOR) 40 MG tablet Take 40 mg by mouth daily.   tamsulosin (FLOMAX) 0.4 MG CAPS capsule Take 1 capsule (0.4 mg total) by mouth daily.   No facility-administered encounter medications on file as of 07/25/2023.  :   Review of Systems:  Out of a complete 14 point review of systems, all are reviewed and negative with the exception of these symptoms as listed below:  Review of Systems  Neurological:        Pt here for sleep consult  Pt snores,headache, hypertension,fatigue  Pt denies cpap machine Pt states Sleep study 20 years ago   ESS:18 FSS:45     Objective:  Neurological Exam  Physical Exam Physical Examination:   Vitals:   07/25/23 1356  BP: (!) 152/92  Pulse: 64    General Examination: The patient is a very pleasant 72 y.o. male in no acute distress. He appears well-developed and well-nourished and well groomed.   HEENT: Normocephalic, atraumatic, pupils are equal, round and reactive to light, bilateral cataracts noted, right eye ptosis which is not new.  He had multiple eyelid surgeries.  Extraocular tracking is good without limitation to gaze excursion or nystagmus noted. Hearing is impaired, no hearing aids in place.  Face is symmetric with normal facial animation. Speech is clear with no dysarthria noted. There is no hypophonia. There is no lip, neck/head, jaw or voice tremor. Neck is supple with full range of passive and active motion. There are no carotid bruits on auscultation. Oropharynx exam reveals: moderate mouth dryness, adequate dental hygiene with several teeth missing on the bottom, he has a partial denture for the bottom.  Marked airway crowding secondary to Mallampati class IV, tip of uvula and tonsils not fully visualized.  Neck circumference 18-1/4 inches.  Tongue protrudes centrally.    Chest: Bibasilar crackles noted.    Heart: S1+S2+0, regular and normal without  murmurs, rubs or gallops noted.   Abdomen: Soft, non-tender and non-distended.  Extremities: There is no pitting edema in the distal lower extremities bilaterally.   Skin: Warm and dry without trophic changes noted.   Musculoskeletal: exam reveals no obvious joint deformities.   Neurologically:  Mental status: The patient is awake, alert and oriented in all 4 spheres. His immediate and remote memory, attention, language skills and fund of knowledge are appropriate. There is no evidence of aphasia, agnosia, apraxia or anomia. Speech is clear with normal prosody and enunciation. Thought process is linear. Mood is normal and affect is normal.  Cranial nerves II - XII are as  described above under HEENT exam.  Motor exam: Normal bulk, strength and tone is noted. There is no obvious action or resting tremor.  Fine motor skills and coordination: grossly intact.  Cerebellar testing: No dysmetria or intention tremor. There is no truncal or gait ataxia.  Sensory exam: intact to light touch in the upper and lower extremities.  Gait, station and balance: He stands easily. No veering to one side is noted. No leaning to one side is noted. Posture is age-appropriate and stance is narrow based. Gait shows normal stride length and normal pace. No problems turning are noted.   Assessment and Plan:  In summary, Manuel Morton is a very pleasant 72 y.o.-year old male with an underlying medical history of hypertension, hyperlipidemia, aortic aneurysm of the thoracic aorta, hearing loss, seizures (per chart review), right lid ptosis, and obesity, who presents for evaluation of his obstructive sleep apnea.  He was diagnosed several years ago but has not been on PAP therapy.  He would be willing to proceed with a sleep study.  I explained the overnight polysomnogram to the patient and also treatment options.  He is strongly advised not to drive when feeling sleepy, he endorses significant daytime somnolence at this time.   I had a long chat with the patient about my findings and the diagnosis of sleep apnea, particularly OSA, its prognosis and treatment options. We talked about medical/conservative treatments, surgical interventions and non-pharmacological approaches for symptom control. I explained, in particular, the risks and ramifications of untreated moderate to severe OSA, especially with respect to developing cardiovascular disease down the road, including congestive heart failure (CHF), difficult to treat hypertension, cardiac arrhythmias (particularly A-fib), neurovascular complications including TIA, stroke and dementia. Even type 2 diabetes has, in part, been linked to untreated OSA. Symptoms of untreated OSA may include (but may not be limited to) daytime sleepiness, nocturia (i.e. frequent nighttime urination), memory problems, mood irritability and suboptimally controlled or worsening mood disorder such as depression and/or anxiety, lack of energy, lack of motivation, physical discomfort, as well as recurrent headaches, especially morning or nocturnal headaches. We talked about the importance of maintaining a healthy lifestyle and striving for healthy weight. In addition, we talked about the importance of striving for and maintaining good sleep hygiene. I recommended a sleep study at this time. I outlined the differences between a laboratory attended sleep study which is considered more comprehensive and accurate over the option of a home sleep test (HST); the latter may lead to underestimation of sleep disordered breathing in some instances and does not help with diagnosing upper airway resistance syndrome and is not accurate enough to diagnose primary central sleep apnea typically. I outlined possible surgical and non-surgical treatment options of OSA, including the use of a positive airway pressure (PAP) device (i.e. CPAP, AutoPAP/APAP or BiPAP in certain circumstances), a custom-made dental device (aka oral  appliance, which would require a referral to a specialist dentist or orthodontist typically, and is generally speaking not considered for patients with full dentures or edentulous state), upper airway surgical options, such as traditional UPPP (which is not considered a first-line treatment) or the Inspire device (hypoglossal nerve stimulator, which would involve a referral for consultation with an ENT surgeon, after careful selection, following inclusion criteria - also not first-line treatment). I explained the PAP treatment option to the patient in detail, as this is generally considered first-line treatment.  The patient indicated that he would be willing to try PAP therapy, if the need arises.  I explained the importance of being compliant with PAP treatment, not only for insurance purposes but primarily to improve patient's symptoms symptoms, and for the patient's long term health benefit, including to reduce His cardiovascular risks longer-term.    We will pick up our discussion about the next steps and treatment options after testing.  He will probably request PAP therapy through the [place].  He may have to get in touch with his [facility] provider regarding treatment of sleep apnea through the [place], we will await test results first of course.  Bibasilar crackles were noted, of note, the patient reports that he is aware he had COVID.  I encouraged him to call with any interim questions, concerns, problems or updates or email [place] through MyChart.  Generally speaking, sleep test authorizations may take up to 2 weeks, sometimes less, sometimes longer, the patient is encouraged to get in touch with [place] if they do not hear back from the sleep lab staff directly within the next 2 weeks.  Huston Foley, MD, PhD

## 2023-07-26 ENCOUNTER — Telehealth: Payer: Self-pay | Admitting: Neurology

## 2023-07-26 ENCOUNTER — Other Ambulatory Visit: Payer: Self-pay | Admitting: Cardiovascular Disease

## 2023-07-26 DIAGNOSIS — Z0000 Encounter for general adult medical examination without abnormal findings: Secondary | ICD-10-CM

## 2023-07-26 NOTE — Telephone Encounter (Signed)
[facility]- [place] [facility]: [id number] (exp. 04/28/23 to 10/25/23)   Patient is scheduled at [facility] for 09/17/23 at 9 pm.  Mailed packet to the patient.

## 2023-08-15 ENCOUNTER — Ambulatory Visit (INDEPENDENT_AMBULATORY_CARE_PROVIDER_SITE_OTHER): Payer: PRIVATE HEALTH INSURANCE

## 2023-08-15 DIAGNOSIS — Z0000 Encounter for general adult medical examination without abnormal findings: Secondary | ICD-10-CM | POA: Diagnosis not present

## 2023-09-17 ENCOUNTER — Ambulatory Visit (INDEPENDENT_AMBULATORY_CARE_PROVIDER_SITE_OTHER): Payer: No Typology Code available for payment source | Admitting: Neurology

## 2023-09-17 DIAGNOSIS — E66811 Obesity, class 1: Secondary | ICD-10-CM

## 2023-09-17 DIAGNOSIS — G4733 Obstructive sleep apnea (adult) (pediatric): Secondary | ICD-10-CM | POA: Diagnosis not present

## 2023-09-17 DIAGNOSIS — G4719 Other hypersomnia: Secondary | ICD-10-CM

## 2023-09-17 DIAGNOSIS — R0683 Snoring: Secondary | ICD-10-CM

## 2023-09-17 DIAGNOSIS — R0681 Apnea, not elsewhere classified: Secondary | ICD-10-CM

## 2023-09-17 DIAGNOSIS — R030 Elevated blood-pressure reading, without diagnosis of hypertension: Secondary | ICD-10-CM

## 2023-09-17 DIAGNOSIS — Z9189 Other specified personal risk factors, not elsewhere classified: Secondary | ICD-10-CM

## 2023-09-17 DIAGNOSIS — G4720 Circadian rhythm sleep disorder, unspecified type: Secondary | ICD-10-CM

## 2023-10-05 ENCOUNTER — Telehealth: Payer: Self-pay | Admitting: Anesthesiology

## 2023-10-05 NOTE — Telephone Encounter (Signed)
 Called pt and informed or sleep study results. Per Dr Buck, study showed moderate to severe sleep apnea. Advised she recommended treatment in the form of autoPAP. Pt requested his sleep study results to be mailed to him along with office visit notes. Pt stated he will call and schedule an appointment with the [facility] neurologist for a follow up to get set up with the auto pap. Pt verbalized understanding and thanked me for calling. pt had not additional questions at this time but was advised to call back if any questions arise. A copy of sleep study results and office visit notes were given to Barnie to be mailed to patient.

## 2023-10-05 NOTE — Telephone Encounter (Signed)
-----   Message from True Mar sent at 09/29/2023 12:06 PM EST ----- Patient referred by the [facility], seen by me on 07/25/23, diagnostic PSG on 09/17/23.    Please call and notify the patient that the recent sleep study showed moderate to severe obstructive sleep apnea. I recommend treatment for in the form of autoPAP. He can m discuss treatment and my recommended autoPAP settings with his [facility] provider. He will need to let us  know, if he wants the report mailed to him or pick it up himself. We can usually not fax the report directly to his [place] provider. He will have to make a FU appt with his [facility] provider himself. Let me know, if he has any questions.    True Mar, MD, PhD [facility])
